# Patient Record
Sex: Male | Born: 1980 | Race: White | Hispanic: No | Smoking: Current every day smoker
Health system: Southern US, Community
[De-identification: ages and names within clinical notes are randomized; demographics above are authoritative.]

---

## 2009-07-16 ENCOUNTER — Emergency Department (HOSPITAL_COMMUNITY): Admission: EM | Admit: 2009-07-16 | Discharge: 2009-07-16 | Payer: Self-pay | Admitting: Emergency Medicine

## 2009-07-22 ENCOUNTER — Inpatient Hospital Stay (HOSPITAL_COMMUNITY): Admission: EM | Admit: 2009-07-22 | Discharge: 2009-07-23 | Payer: Self-pay | Admitting: Emergency Medicine

## 2011-01-29 LAB — POCT I-STAT, CHEM 8
Creatinine, Ser: 0.9 mg/dL (ref 0.4–1.5)
HCT: 46 % (ref 39.0–52.0)
Hemoglobin: 15.6 g/dL (ref 13.0–17.0)
Sodium: 136 mEq/L (ref 135–145)
TCO2: 31 mmol/L (ref 0–100)

## 2020-07-17 ENCOUNTER — Emergency Department (HOSPITAL_COMMUNITY): Payer: Self-pay

## 2020-07-17 ENCOUNTER — Encounter (HOSPITAL_COMMUNITY): Payer: Self-pay | Admitting: Emergency Medicine

## 2020-07-17 ENCOUNTER — Emergency Department (HOSPITAL_COMMUNITY)
Admission: EM | Admit: 2020-07-17 | Discharge: 2020-07-17 | Disposition: A | Discharge status: Home / Self Care | Payer: Self-pay | Attending: Emergency Medicine | Admitting: Emergency Medicine

## 2020-07-17 ENCOUNTER — Other Ambulatory Visit: Payer: Self-pay

## 2020-07-17 DIAGNOSIS — F17200 Nicotine dependence, unspecified, uncomplicated: Secondary | ICD-10-CM | POA: Insufficient documentation

## 2020-07-17 DIAGNOSIS — M25561 Pain in right knee: Secondary | ICD-10-CM | POA: Insufficient documentation

## 2020-07-17 DIAGNOSIS — X501XXA Overexertion from prolonged static or awkward postures, initial encounter: Secondary | ICD-10-CM | POA: Insufficient documentation

## 2020-07-17 DIAGNOSIS — Y9301 Activity, walking, marching and hiking: Secondary | ICD-10-CM | POA: Insufficient documentation

## 2020-07-17 MED ORDER — OXYCODONE-ACETAMINOPHEN 5-325 MG PO TABS
1.0000 | ORAL_TABLET | Freq: Once | ORAL | Status: AC
  Administered 2020-07-17: 1 via ORAL
  Filled 2020-07-17: qty 1

## 2020-07-17 NOTE — Progress Notes (Signed)
Orthopedic Tech Progress Note Patient Details:  Lela Gell [birth year] [id number]  Ortho Devices Type of Ortho Device: Knee Immobilizer, Crutches Ortho Device/Splint Location: RLE Ortho Device/Splint Interventions: Ordered, Application, Adjustment   Post Interventions Patient Tolerated: Well Instructions Provided: Care of device, Adjustment of device, Poper ambulation with device   Gill Delrossi 07/17/2020, 2:08 PM

## 2020-07-17 NOTE — ED Triage Notes (Signed)
Pt states he twisted R knee 1 week ago and heard it pop.  C/o gradual increase in pain and swelling.

## 2020-07-17 NOTE — Discharge Instructions (Addendum)
At this time there does not appear to be the presence of an emergent medical condition, however there is always the potential for conditions to change. Please read and follow the below instructions.  Please return to the Emergency Department immediately for any new or worsening symptoms. Please be sure to follow up with your Primary Care Provider within one week regarding your visit today; please call their office to schedule an appointment even if you are feeling better for a follow-up visit. Please call the orthopedic specialist Dr. Roda Shutters on your discharge paperwork today to schedule follow-up appointment for further evaluation and treatment of the right knee pain.  You please use the knee immobilizer and crutches provided today when moving around to avoid lacing weight on your right leg until cleared by the orthopedist.  You may remove the knee immobilizer when you are laying down and resting.  Use rest ice and elevation to help with pain and swelling.  You may use over-the-counter anti-inflammatory such as Tylenol and ibuprofen as directed on packaging to help with pain.  You were given 1 dose of pain medication in the ER today, this may make you drowsy, do not drive, drink alcohol or perform any dangerous activities for the rest of the day.  Go to the nearest Emergency Department immediately if: Your knee swells, and the swelling gets worse. You cannot move your knee. You have very bad knee pain. You have fever or chills You have any new/concerning or worsening of symptoms  Please read the additional information packets attached to your discharge summary.  Do not take your medicine if  develop an itchy rash, swelling in your mouth or lips, or difficulty breathing; call 911 and seek immediate emergency medical attention if this occurs.  You may review your lab tests and imaging results in their entirety on your MyChart account.  Please discuss all results of fully with your primary care provider  and other specialist at your follow-up visit.  Note: Portions of this text may have been transcribed using voice recognition software. Every effort was made to ensure accuracy; however, inadvertent computerized transcription errors may still be present.

## 2020-07-17 NOTE — ED Provider Notes (Signed)
[facility] EMERGENCY DEPARTMENT Provider Note   CSN: [id number] Arrival date & time: 07/17/20  1133     History Chief Complaint  Patient presents with   Knee Pain    Jay Graham is a 39 y.o. male otherwise healthy no daily medication use.  Patient reports 1 week ago he was walking when he turned to change direction planting on his right leg.  When he twisted he felt a pop inside of his right knee and had immediate onset pain which he described as a moderate intensity throb constant nonradiating worsened with ambulation no alleviating factors.  Over the next day he noticed some swelling around his right knee.  He attempted home therapies without relief when pain did not improve he presents to this emergency department for evaluation.  Denies fever/chills, fall, head injury, loss of consciousness, blood thinner use, neck pain, chest pain, back pain, abdominal pain, numbness/tingling, weakness or any additional concerns.  HPI     History reviewed. No pertinent past medical history.  There are no problems to display for this patient.   History reviewed. No pertinent surgical history.     No family history on file.  Social History   Tobacco Use   Smoking status: Current Every Day Smoker   Smokeless tobacco: Never Used  Substance Use Topics   Alcohol use: Not Currently   Drug use: Not Currently    Home Medications Prior to Admission medications   Not on File    Allergies    Patient has no allergy information on record.  Review of Systems   Review of Systems  Constitutional: Negative.  Negative for chills and fever.  Cardiovascular: Negative.  Negative for chest pain.  Gastrointestinal: Negative.  Negative for abdominal pain.  Musculoskeletal: Positive for arthralgias (Right knee). Negative for back pain and neck pain.  Skin: Negative.  Negative for color change and wound.  Neurological: Negative.  Negative for weakness and numbness.     Physical Exam Updated Vital Signs BP 128/78 (BP Location: Left Arm)    Pulse 71    Temp 97.9 F (36.6 C) (Oral)    Resp 18    SpO2 95%   Physical Exam Constitutional:      General: He is not in acute distress.    Appearance: Normal appearance. He is well-developed. He is not ill-appearing or diaphoretic.  HENT:     Head: Normocephalic and atraumatic.  Eyes:     General: Vision grossly intact. Gaze aligned appropriately.     Pupils: Pupils are equal, round, and reactive to light.  Neck:     Trachea: Trachea and phonation normal.  Pulmonary:     Effort: Pulmonary effort is normal. No respiratory distress.  Abdominal:     General: There is no distension.     Palpations: Abdomen is soft.     Tenderness: There is no abdominal tenderness. There is no guarding or rebound.  Musculoskeletal:        General: Normal range of motion.     Cervical back: Normal range of motion.     Comments: Right Knee: Mild swelling anteriorly.  Otherwise normal.  No deformity.  No bony tenderness to the patella.  Mild bilateral joint line pain without crepitus.  No skin break, erythema, fluctuance or increased warmth.  Pain worsened with flexion and extension, range of motion is intact with pain.    Negative anterior/poster drawer bilaterally. No tenderness to palpation of hips or ankles.Compartments soft. Neurovascularly intact distally to  site of injury.   No midline C/T/L spinal tenderness to palpation, no paraspinal muscle tenderness, no deformity, crepitus, or step-off noted. No sign of injury to the neck or back.  Skin:    General: Skin is warm and dry.  Neurological:     Mental Status: He is alert.     GCS: GCS eye subscore is 4. GCS verbal subscore is 5. GCS motor subscore is 6.     Comments: Speech is clear and goal oriented, follows commands Major Cranial nerves without deficit, no facial droop Moves extremities without ataxia, coordination intact  Psychiatric:        Behavior: Behavior  normal.     ED Results / Procedures / Treatments   Labs (all labs ordered are listed, but only abnormal results are displayed) Labs Reviewed - No data to display  EKG None  Radiology DG Knee Complete 4 Views Right  Result Date: 07/17/2020 CLINICAL DATA:  Pain and swelling of the right knee EXAM: RIGHT KNEE - COMPLETE 4+ VIEW COMPARISON:  None. FINDINGS: No evidence of fracture, dislocation, or joint effusion. No evidence of arthropathy or other focal bone abnormality. Soft tissues are unremarkable. IMPRESSION: Negative. Electronically Signed   By: Sherian Rein M.D.   On: 07/17/2020 12:08    Procedures Procedures (including critical care time)  Medications Ordered in ED Medications  oxyCODONE-acetaminophen (PERCOCET/ROXICET) 5-325 MG per tablet 1 tablet (1 tablet Oral Given 07/17/20 1308)    ED Course  I have reviewed the triage vital signs and the nursing notes.  Pertinent labs & imaging results that were available during my care of the patient were reviewed by me and considered in my medical decision making (see chart for details).    MDM Rules/Calculators/A&P                         Additional history obtained from: 1. Nursing notes from this visit.   DG Right Knee:  IMPRESSION:  Negative.  - History and physical examination concerning for ligamentous, potentially ACL also meniscal injury is also a possibility.  No evidence of septic arthritis, DVT, compartment syndrome, neurovascular compromise or other emergent pathologies.  I personally reviewed patient's x-rays, no obvious fractures or dislocations.  He has good range of motion at the joint above and below the knee.  Pedal pulses intact and equal bilaterally, capillary refill and sensation intact to all toes.  Patient was placed in the immobilizer, given crutches and referral to Ortho.  1 pill Percocet was given in ER for pain, his girlfriend is going to drive home today.  Patient urged to call orthopedist office today  to schedule follow-up appointment for further evaluation and care of his knee.  He will use rice therapy and OTC anti-inflammatories at home.  At this time there does not appear to be any evidence of an acute emergency medical condition and the patient appears stable for discharge with appropriate outpatient follow up. Diagnosis was discussed with patient who verbalizes understanding of care plan and is agreeable to discharge. I have discussed return precautions with patient who verbalizes understanding. Patient encouraged to follow-up with their PCP and ortho. All questions answered.  Note: Portions of this report may have been transcribed using voice recognition software. Every effort was made to ensure accuracy; however, inadvertent computerized transcription errors may still be present. Final Clinical Impression(s) / ED Diagnoses Final diagnoses:  Acute pain of right knee    Rx / DC Orders ED Discharge  Orders    None       Elizabeth Palau 07/17/20 1324    Benjiman Core, MD 07/17/20 1511

## 2022-04-18 IMAGING — CR DG KNEE COMPLETE 4+V*R*
4 series · 4 of 4 positions shown · non-contrast
Comparison: None.

CLINICAL DATA: Pain and swelling of the right knee

EXAM:
RIGHT KNEE - COMPLETE 4+ VIEW

[knee ap]
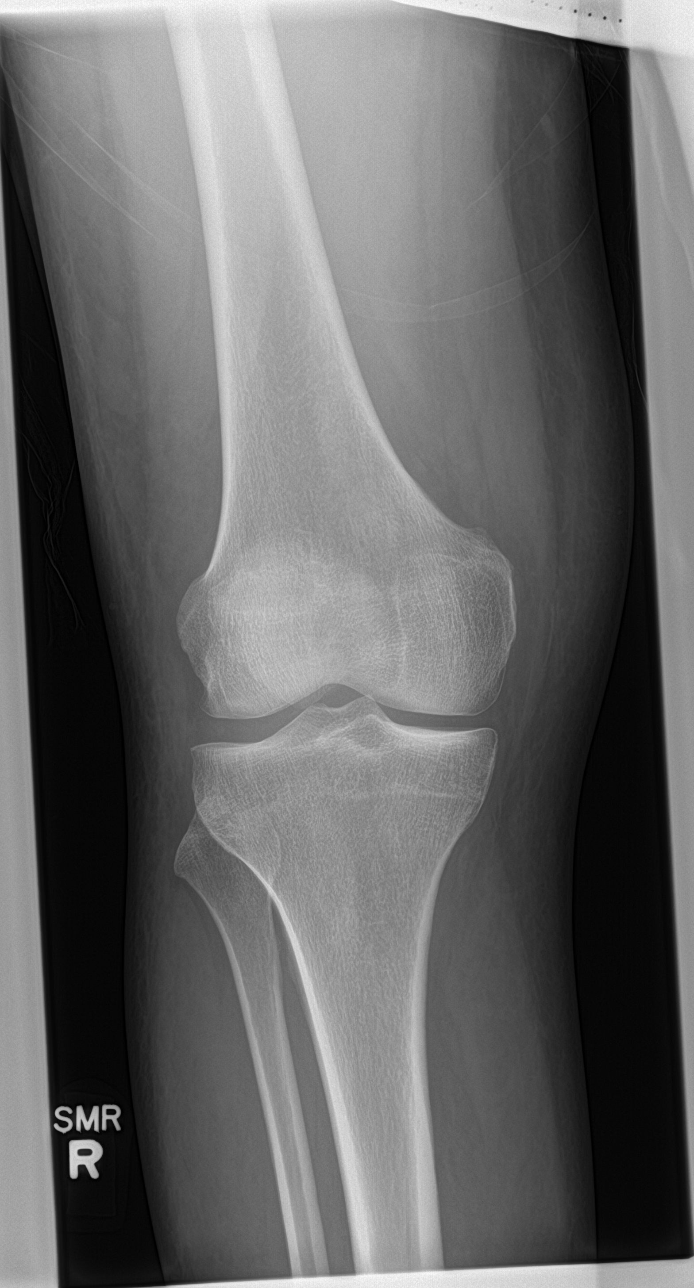

[knee lat]
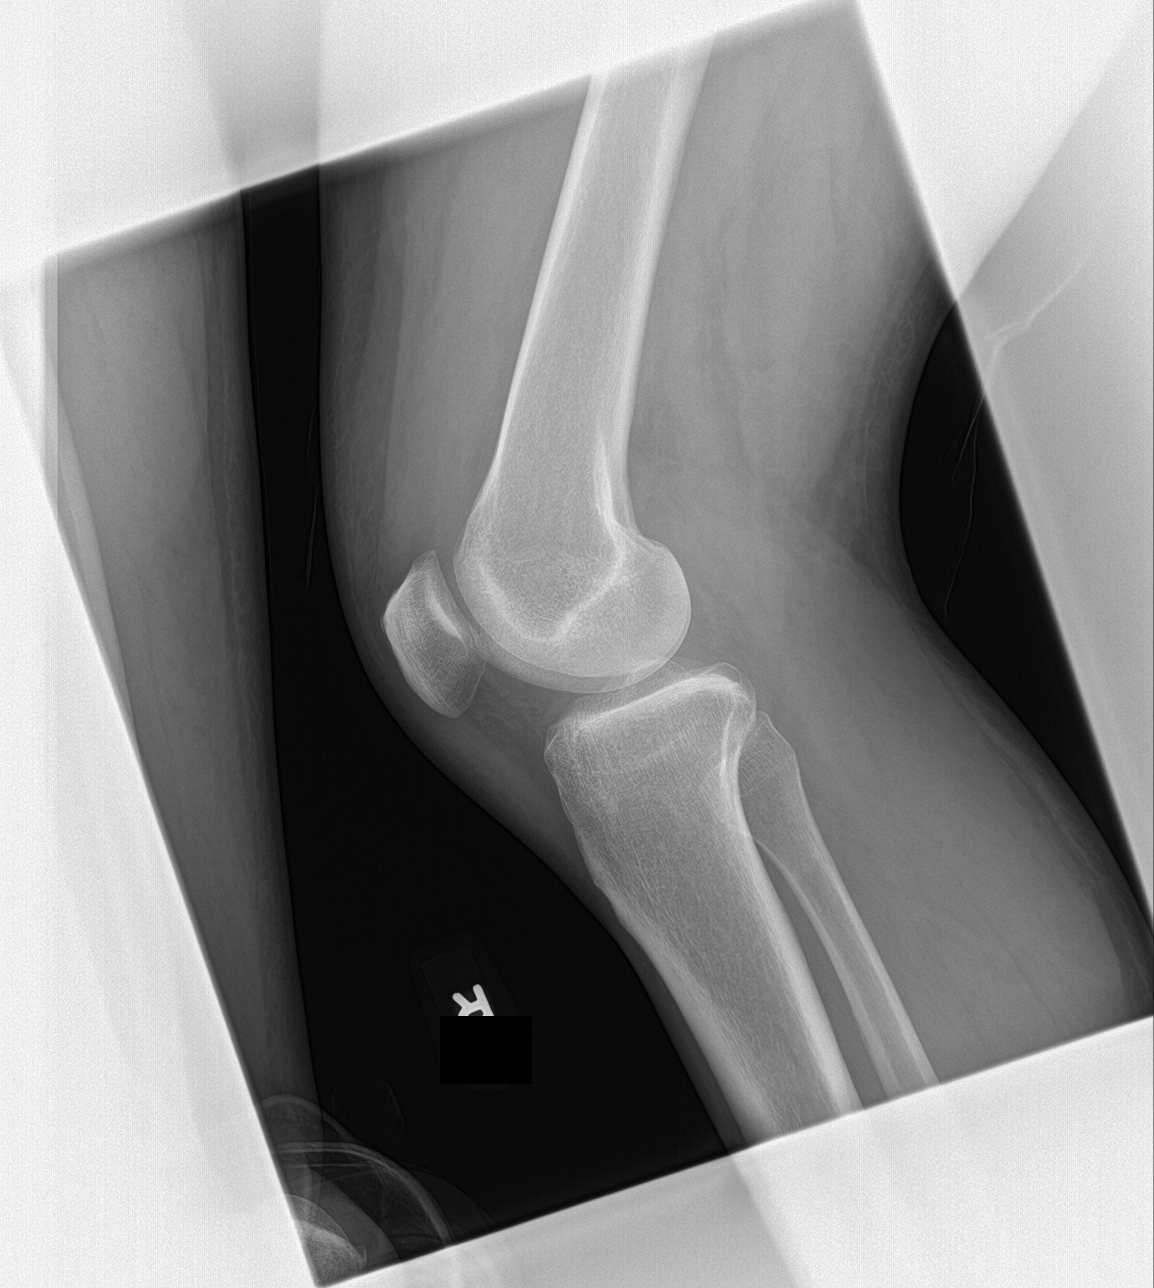

[knee obl (1 of 2)]
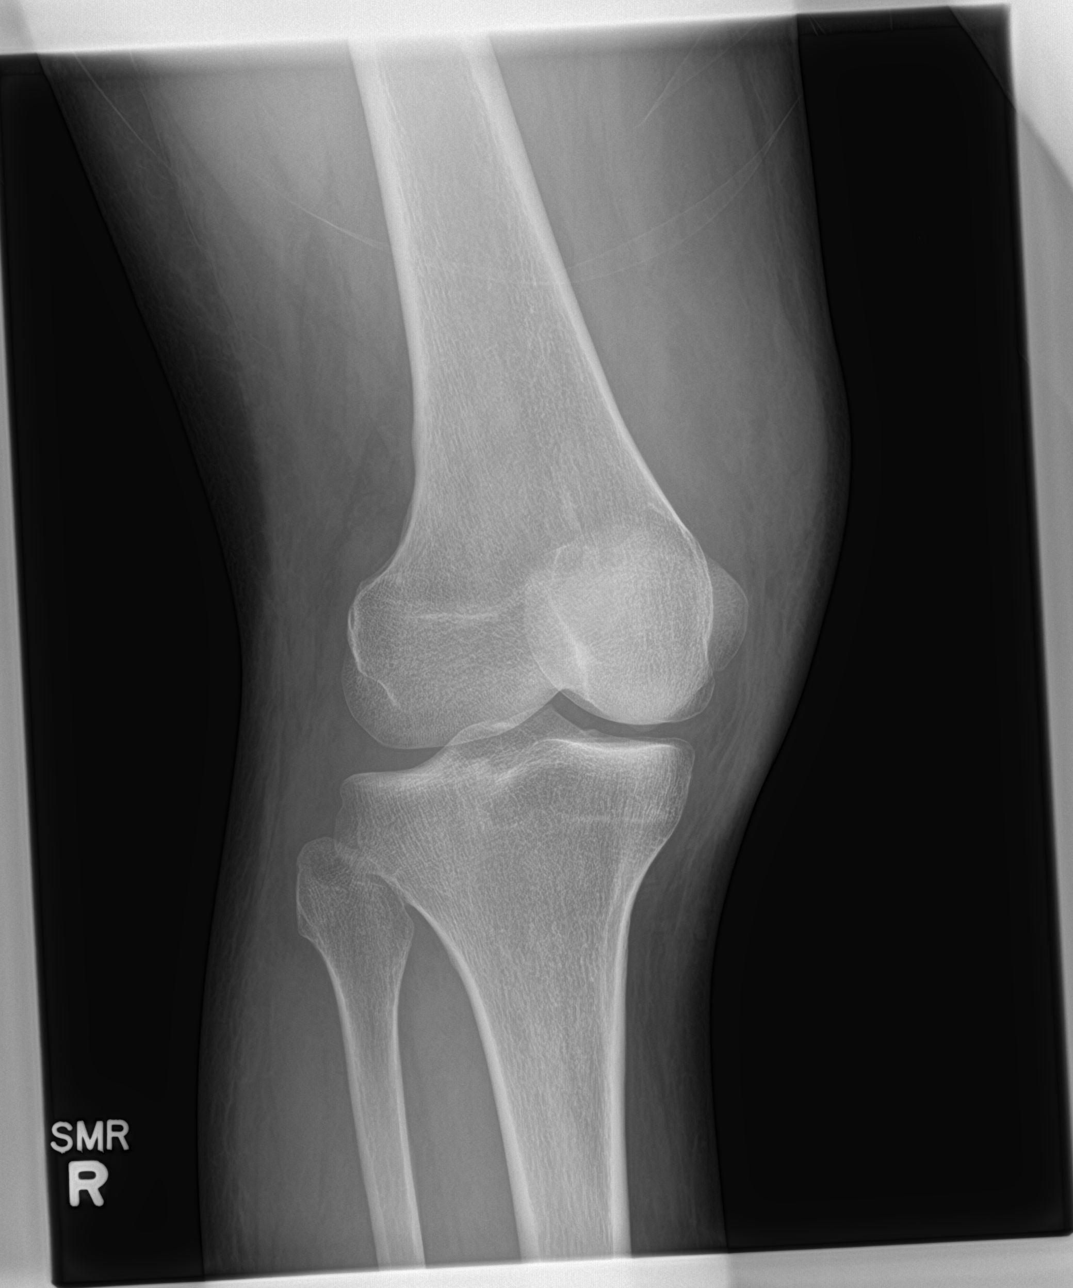

[knee obl (2 of 2)]
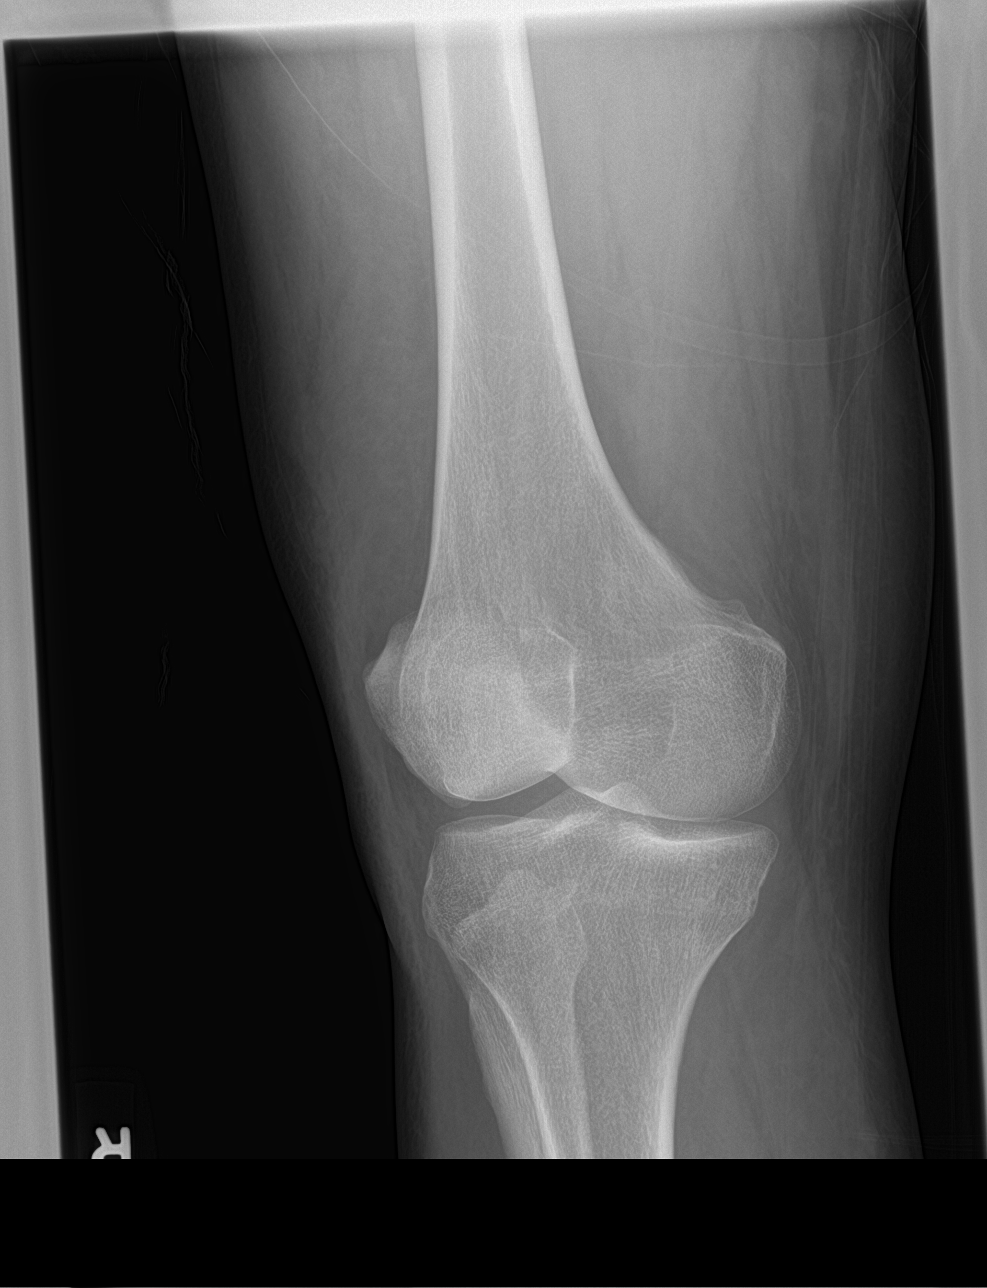

[4 of 4 positions shown; findings below may reference images not displayed]

FINDINGS: No evidence of fracture, dislocation, or joint effusion. No evidence
of arthropathy or other focal bone abnormality. Soft tissues are
unremarkable.
IMPRESSION: Negative.
